# Patient Record
Sex: Male | Born: 1975 | Race: White | Hispanic: No | Marital: Married | Smoking: Never smoker
Health system: Southern US, Community
[De-identification: ages and names within clinical notes are randomized; demographics above are authoritative.]

## PROBLEM LIST (undated history)

## (undated) DIAGNOSIS — M5430 Sciatica, unspecified side: Secondary | ICD-10-CM

## (undated) HISTORY — PX: TOOTH EXTRACTION: SUR596

---

## 2011-06-22 ENCOUNTER — Encounter (HOSPITAL_COMMUNITY): Payer: Self-pay | Admitting: Emergency Medicine

## 2011-06-22 ENCOUNTER — Emergency Department (INDEPENDENT_AMBULATORY_CARE_PROVIDER_SITE_OTHER)
Admission: EM | Admit: 2011-06-22 | Discharge: 2011-06-22 | Disposition: A | Discharge status: Home / Self Care | Source: Home / Self Care | Attending: Emergency Medicine | Admitting: Emergency Medicine

## 2011-06-22 DIAGNOSIS — M5430 Sciatica, unspecified side: Secondary | ICD-10-CM

## 2011-06-22 DIAGNOSIS — IMO0002 Reserved for concepts with insufficient information to code with codable children: Secondary | ICD-10-CM

## 2011-06-22 MED ORDER — HYDROCODONE-IBUPROFEN 7.5-200 MG PO TABS: 1.0000 | ORAL_TABLET | Freq: Three times a day (TID) | ORAL | Status: AC | PRN

## 2011-06-22 MED ORDER — CYCLOBENZAPRINE HCL 10 MG PO TABS: 10.0000 mg | ORAL_TABLET | Freq: Three times a day (TID) | ORAL | Status: AC | PRN

## 2011-06-22 NOTE — Discharge Instructions (Signed)
As discussed take both prescribed medicines consistently for the next 3-5 days. Has discomfort improves try illustrated exercises. Should experience improvement within the next 3-5 days if any worsening or further concerns return for recheck. If pain persists beyond 7-10 days followup with orthopedic provider as discussed    Back Exercises Back exercises help treat and prevent back injuries. The goal of back exercises is to increase the strength of your abdominal and back muscles and the flexibility of your back. These exercises should be started when you no longer have back pain. Back exercises include:  Pelvic Tilt. Lie on your back with your knees bent. Tilt your pelvis until the lower part of your back is against the floor. Hold this position 5 to 10 sec and repeat 5 to 10 times.   Knee to Chest. Pull first 1 knee up against your chest and hold for 20 to 30 seconds, repeat this with the other knee, and then both knees. This may be done with the other leg straight or bent, whichever feels better.   Sit-Ups or Curl-Ups. Bend your knees 90 degrees. Start with tilting your pelvis, and do a partial, slow sit-up, lifting your trunk only 30 to 45 degrees off the floor. Take at least 2 to 3 seconds for each sit-up. Do not do sit-ups with your knees out straight. If partial sit-ups are difficult, simply do the above but with only tightening your abdominal muscles and holding it as directed.   Hip-Lift. Lie on your back with your knees flexed 90 degrees. Push down with your feet and shoulders as you raise your hips a couple inches off the floor; hold for 10 seconds, repeat 5 to 10 times.   Back arches. Lie on your stomach, propping yourself up on bent elbows. Slowly press on your hands, causing an arch in your low back. Repeat 3 to 5 times. Any initial stiffness and discomfort should lessen with repetition over time.   Shoulder-Lifts. Lie face down with arms beside your body. Keep hips and torso pressed  to floor as you slowly lift your head and shoulders off the floor.  Do not overdo your exercises, especially in the beginning. Exercises may cause you some mild back discomfort which lasts for a few minutes; however, if the pain is more severe, or lasts for more than 15 minutes, do not continue exercises until you see your caregiver. Improvement with exercise therapy for back problems is slow.  See your caregivers for assistance with developing a proper back exercise program. Document Released: 03/31/2004 Document Revised: 02/10/2011 Document Reviewed: 02/21/2005 [facility] Patient Information 2012 [place], [place].

## 2011-06-22 NOTE — ED Notes (Signed)
PT HERE WITH C/O LOWER RIGHT BACK PAIN RADIATING TO HIP AND R KNEE THAT STARTED Monday AFTER INSTALLING KITCHEN CABINETS.PT STATES HE TRIED BACK EXERCISES AND IBUPROFEN BUT PAIN WORSENED YESTERDAY WITH TWISTING.DENIES NUMB/TINGLING.

## 2011-06-22 NOTE — ED Provider Notes (Signed)
History     CSN: [id number]  Arrival date & time 06/22/11  7846   First MD Initiated Contact with Patient 06/22/11 [phone]      Chief Complaint  Patient presents with  . Back Pain    (Consider location/radiation/quality/duration/timing/severity/associated sxs/prior treatment) HPI Comments: Patient spent part of this week and is telling some kitchen cabinets and exerting some significant physical efforts into this instillation process. Started experiencing mainly lower right back pain which radiates towards his hip and the right side of his thigh up to his right knee has been increasing since Monday. Some degree of stiffness and can identify certain movements that make pain exacerbates. Patient denies any falls injuries or any skin changes. Denies any numbness or tingling sensations. Patient also denies any fevers or muscular weakness. No urinary symptoms  Patient is a 36 y.o. male presenting with back pain. The history is provided by the patient.  Back Pain  This is a new problem. The current episode started more than 2 days ago. The problem occurs constantly. The problem has been gradually worsening. The pain is associated with lifting heavy objects and twisting. The pain is moderate. The symptoms are aggravated by twisting and bending. Stiffness is present all day. Pertinent negatives include no fever, no numbness, no abdominal pain, no bladder incontinence, no dysuria, no pelvic pain, no paresthesias, no paresis and no tingling. He has tried NSAIDs for the symptoms. The treatment provided mild relief.    History reviewed. No pertinent past medical history.  Past Surgical History  Procedure Date  . Tooth extraction     No family history on file.  History  Substance Use Topics  . Smoking status: Never Smoker   . Smokeless tobacco: Not on file  . Alcohol Use: Yes      Review of Systems  Constitutional: Negative for fever.  HENT: Negative for congestion and neck pain.     Cardiovascular: Negative for palpitations.  Gastrointestinal: Negative for abdominal pain.  Genitourinary: Negative for bladder incontinence, dysuria, flank pain, discharge and pelvic pain.  Musculoskeletal: Positive for back pain. Negative for myalgias, joint swelling and gait problem.  Neurological: Negative for tingling, numbness and paresthesias.    Allergies  Lidocaine and Marcaine  Home Medications   Current Outpatient Rx  Name Route Sig Dispense Refill  . CYCLOBENZAPRINE HCL 10 MG PO TABS Oral Take 1 tablet (10 mg total) by mouth 3 (three) times daily as needed for muscle spasms. 20 tablet 0  . HYDROCODONE-IBUPROFEN 7.5-200 MG PO TABS Oral Take 1 tablet by mouth every 8 (eight) hours as needed for pain. 30 tablet 0    BP 130/75  Pulse 58  Temp(Src) 97.8 F (36.6 C) (Oral)  Resp 14  SpO2 100%  Physical Exam  Nursing note and vitals reviewed. Constitutional: He appears well-developed and well-nourished. No distress.  Abdominal: Soft.  Musculoskeletal: He exhibits tenderness.       Lumbar back: He exhibits decreased range of motion, tenderness and pain. He exhibits no swelling, no edema, no deformity, no laceration and normal pulse.       Back:  Skin: No rash noted.    ED Course  Procedures (including critical care time)  Labs Reviewed - No data to display No results found.   1. Lumbar sprain and strain   2. Sciatic nerve pain       MDM  Paravertebral lumbar back sprain with sciatica distribution of pain pulse physical work during the weekend. Have recommended symptom management for  the next 72 hours with and states and muscle relaxers followup as necessary with [place] or orthopedic provider. Patient agree with treatment plan and followup care as necessary.        Jimmie Molly, MD 06/22/11 1351

## 2011-08-25 ENCOUNTER — Emergency Department (HOSPITAL_COMMUNITY)
Admission: EM | Admit: 2011-08-25 | Discharge: 2011-08-25 | Disposition: A | Discharge status: Home / Self Care | Attending: Emergency Medicine | Admitting: Emergency Medicine

## 2011-08-25 ENCOUNTER — Encounter (HOSPITAL_COMMUNITY): Payer: Self-pay | Admitting: *Deleted

## 2011-08-25 DIAGNOSIS — M542 Cervicalgia: Secondary | ICD-10-CM | POA: Insufficient documentation

## 2011-08-25 DIAGNOSIS — R51 Headache: Secondary | ICD-10-CM | POA: Insufficient documentation

## 2011-08-25 DIAGNOSIS — Z884 Allergy status to anesthetic agent status: Secondary | ICD-10-CM | POA: Insufficient documentation

## 2011-08-25 HISTORY — DX: Sciatica, unspecified side: M54.30

## 2011-08-25 MED ORDER — IBUPROFEN 800 MG PO TABS
800.0000 mg | ORAL_TABLET | Freq: Once | ORAL | Status: AC
  Administered 2011-08-25: 800 mg via ORAL
  Filled 2011-08-25: qty 1

## 2011-08-25 MED ORDER — CYCLOBENZAPRINE HCL 10 MG PO TABS: 10.0000 mg | ORAL_TABLET | Freq: Two times a day (BID) | ORAL | Status: AC | PRN

## 2011-08-25 MED ORDER — IBUPROFEN 800 MG PO TABS: 800.0000 mg | ORAL_TABLET | Freq: Three times a day (TID) | ORAL | Status: AC
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
## 2011-08-29 NOTE — ED Provider Notes (Signed)
Medical screening examination/treatment/procedure(s) were performed by non-physician practitioner and as supervising physician I was immediately available for consultation/collaboration.   Carleene Cooper III, MD 08/29/11 2105

## 2011-09-01 ENCOUNTER — Other Ambulatory Visit: Payer: Self-pay | Admitting: Occupational Medicine

## 2011-09-01 ENCOUNTER — Ambulatory Visit: Payer: Self-pay

## 2011-09-01 DIAGNOSIS — M25559 Pain in unspecified hip: Secondary | ICD-10-CM

## 2011-12-05 ENCOUNTER — Other Ambulatory Visit: Payer: Self-pay | Admitting: Occupational Medicine

## 2011-12-05 ENCOUNTER — Ambulatory Visit: Payer: Self-pay

## 2011-12-05 DIAGNOSIS — R52 Pain, unspecified: Secondary | ICD-10-CM

## 2013-04-11 ENCOUNTER — Emergency Department (HOSPITAL_COMMUNITY)
Admission: EM | Admit: 2013-04-11 | Discharge: 2013-04-11 | Disposition: A | Discharge status: Home / Self Care | Attending: Emergency Medicine | Admitting: Emergency Medicine

## 2013-04-11 ENCOUNTER — Encounter (HOSPITAL_COMMUNITY): Payer: Self-pay | Admitting: Emergency Medicine

## 2013-04-11 DIAGNOSIS — Z8739 Personal history of other diseases of the musculoskeletal system and connective tissue: Secondary | ICD-10-CM | POA: Insufficient documentation

## 2013-04-11 DIAGNOSIS — N200 Calculus of kidney: Secondary | ICD-10-CM | POA: Insufficient documentation

## 2013-04-11 LAB — POCT I-STAT, CHEM 8
BUN: 15 mg/dL (ref 6–23)
CALCIUM ION: 1.22 mmol/L (ref 1.12–1.23)
CHLORIDE: 101 meq/L (ref 96–112)
Creatinine, Ser: 1.3 mg/dL (ref 0.50–1.35)
GLUCOSE: 92 mg/dL (ref 70–99)
HCT: 46 % (ref 39.0–52.0)
Hemoglobin: 15.6 g/dL (ref 13.0–17.0)
POTASSIUM: 4.1 meq/L (ref 3.7–5.3)
Sodium: 140 mEq/L (ref 137–147)
TCO2: 27 mmol/L (ref 0–100)

## 2013-04-11 LAB — URINALYSIS, ROUTINE W REFLEX MICROSCOPIC
BILIRUBIN URINE: NEGATIVE
GLUCOSE, UA: NEGATIVE mg/dL
HGB URINE DIPSTICK: NEGATIVE
KETONES UR: NEGATIVE mg/dL
Leukocytes, UA: NEGATIVE
Nitrite: NEGATIVE
PH: 7 (ref 5.0–8.0)
Protein, ur: NEGATIVE mg/dL
SPECIFIC GRAVITY, URINE: 1.004 — AB (ref 1.005–1.030)
Urobilinogen, UA: 0.2 mg/dL (ref 0.0–1.0)

## 2013-04-11 MED ORDER — HYDROCODONE-ACETAMINOPHEN 5-325 MG PO TABS: 1.0000 | ORAL_TABLET | Freq: Four times a day (QID) | ORAL | Status: AC | PRN

## 2013-04-11 NOTE — ED Notes (Signed)
Pt states he is having right flank pain that started this morning around 0430  Pt states his back was hurting a little yesterday in the same area but is worse this morning

## 2013-04-11 NOTE — Discharge Instructions (Signed)
Kidney Stones  Kidney stones (urolithiasis) are deposits that form inside your kidneys. The intense pain is caused by the stone moving through the urinary tract. When the stone moves, the ureter goes into spasm around the stone. The stone is usually passed in the urine.   CAUSES   · A disorder that makes certain neck glands produce too much parathyroid hormone (primary hyperparathyroidism).  · A buildup of uric acid crystals, similar to gout in your joints.  · Narrowing (stricture) of the ureter.  · A kidney obstruction present at birth (congenital obstruction).  · Previous surgery on the kidney or ureters.  · Numerous kidney infections.  SYMPTOMS   · Feeling sick to your stomach (nauseous).  · Throwing up (vomiting).  · Blood in the urine (hematuria).  · Pain that usually spreads (radiates) to the groin.  · Frequency or urgency of urination.  DIAGNOSIS   · Taking a history and physical exam.  · Blood or urine tests.  · CT scan.  · Occasionally, an examination of the inside of the urinary bladder (cystoscopy) is performed.  TREATMENT   · Observation.  · Increasing your fluid intake.  · Extracorporeal shock wave lithotripsy This is a noninvasive procedure that uses shock waves to break up kidney stones.  · Surgery may be needed if you have severe pain or persistent obstruction. There are various surgical procedures. Most of the procedures are performed with the use of small instruments. Only small incisions are needed to accommodate these instruments, so recovery time is minimized.  The size, location, and chemical composition are all important variables that will determine the proper choice of action for you. Talk to your health care provider to better understand your situation so that you will minimize the risk of injury to yourself and your kidney.   HOME CARE INSTRUCTIONS   · Drink enough water and fluids to keep your urine clear or pale yellow. This will help you to pass the stone or stone fragments.  · Strain  all urine through the provided strainer. Keep all particulate matter and stones for your health care provider to see. The stone causing the pain may be as small as a grain of salt. It is very important to use the strainer each and every time you pass your urine. The collection of your stone will allow your health care provider to analyze it and verify that a stone has actually passed. The stone analysis will often identify what you can do to reduce the incidence of recurrences.  · Only take over-the-counter or prescription medicines for pain, discomfort, or fever as directed by your health care provider.  · Make a follow-up appointment with your health care provider as directed.  · Get follow-up X-rays if required. The absence of pain does not always mean that the stone has passed. It may have only stopped moving. If the urine remains completely obstructed, it can cause loss of kidney function or even complete destruction of the kidney. It is your responsibility to make sure X-rays and follow-ups are completed. Ultrasounds of the kidney can show blockages and the status of the kidney. Ultrasounds are not associated with any radiation and can be performed easily in a matter of minutes.  SEEK MEDICAL CARE IF:  · You experience pain that is progressive and unresponsive to any pain medicine you have been prescribed.  SEEK IMMEDIATE MEDICAL CARE IF:   · Pain cannot be controlled with the prescribed medicine.  · You have a fever   or shaking chills.  · The severity or intensity of pain increases over 18 hours and is not relieved by pain medicine.  · You develop a new onset of abdominal pain.  · You feel faint or pass out.  · You are unable to urinate.  MAKE SURE YOU:   · Understand these instructions.  · Will watch your condition.  · Will get help right away if you are not doing well or get worse.  Document Released: 02/21/2005 Document Revised: 10/24/2012 Document Reviewed: 07/25/2012  [employer]® Patient Information ©2014  [employer].

## 2013-04-11 NOTE — ED Provider Notes (Addendum)
CSN: [id number]     Arrival date & time 04/11/13  4098 History   First MD Initiated Contact with Patient 04/11/13 [phone]     Chief Complaint  Patient presents with  . Flank Pain   (Consider location/radiation/quality/duration/timing/severity/associated sxs/prior Treatment) Patient is a 38 y.o. male presenting with flank pain. The history is provided by the patient.  Flank Pain This is a new problem. The current episode started 3 to 5 hours ago. The problem occurs constantly. The problem has been rapidly improving. Pertinent negatives include no abdominal pain and no shortness of breath. Associated symptoms comments: No fever, n/v or change in urine.  Denies dysuria.  About 2 weeks ago had a similar pain which was less severe that resolved.. Nothing (he can't do anything to worsen the pain.  Bending/twisting/lifting makes no difference) aggravates the symptoms. Nothing relieves the symptoms. He has tried nothing for the symptoms. The treatment provided significant relief.    Past Medical History  Diagnosis Date  . Sciatica    Past Surgical History  Procedure Laterality Date  . Tooth extraction     Family History  Problem Relation Age of Onset  . Hypertension Mother   . Hypertension Father   . Diabetes Father   . COPD Father    History  Substance Use Topics  . Smoking status: Never Smoker   . Smokeless tobacco: Not on file  . Alcohol Use: Yes     Comment: occ    Review of Systems  Constitutional: Negative for fever.  Respiratory: Negative for shortness of breath.   Gastrointestinal: Negative for nausea, vomiting and abdominal pain.  Genitourinary: Positive for flank pain. Negative for dysuria, hematuria and testicular pain.  All other systems reviewed and are negative.    Allergies  Bupivacaine hcl and Lidocaine  Home Medications  No current outpatient prescriptions on file. BP 122/62  Pulse 50  Temp(Src) 98.7 F (37.1 C) (Oral)  Resp 14  SpO2 99% Physical Exam   Nursing note and vitals reviewed. Constitutional: He is oriented to person, place, and time. He appears well-developed and well-nourished. No distress.  HENT:  Head: Normocephalic and atraumatic.  Mouth/Throat: Oropharynx is clear and moist.  Eyes: Conjunctivae and EOM are normal. Pupils are equal, round, and reactive to light.  Neck: Normal range of motion. Neck supple.  Cardiovascular: Normal rate.   Pulmonary/Chest: Effort normal.  Abdominal: Soft. He exhibits no distension. There is no tenderness. There is no rebound, no guarding and no CVA tenderness.  Musculoskeletal: Normal range of motion. He exhibits no edema and no tenderness.  Neurological: He is alert and oriented to person, place, and time.  Skin: Skin is warm and dry. No rash noted. No erythema.  Psychiatric: He has a normal mood and affect. His behavior is normal.    ED Course  Procedures (including critical care time) Labs Review Labs Reviewed  URINALYSIS, ROUTINE W REFLEX MICROSCOPIC - Abnormal; Notable for the following:    Specific Gravity, Urine 1.004 (*)    All other components within normal limits  POCT I-STAT, CHEM 8   Imaging Review No results found.  EKG Interpretation   None       MDM   1. Kidney stone on right side     Pt with sx concerning for KS.  Right flank pain without radiation that recurred at 4:30 am where wife states he was diaphoretic and extremely uncomfortable.  States about 2 weeks ago had a similar pain in the right flank that  resolved and was not as severe.  Pt currently has only a dull ache.  Denies N/V/D or fever.  No prior hx of KS.  Will get UA and i-stat to evaluate.  7:59 AM uA and i-stat wnl.  Will have pt use NSAIDs prn for pain and give ppx if pain worsens.  Also given f/u with urology.  Gwyneth SproutWhitney Diannie Willner, MD 04/11/13 0800  Gwyneth SproutWhitney Katriel Cutsforth, MD 04/11/13 [phone]

## 2017-07-11 ENCOUNTER — Other Ambulatory Visit: Payer: Self-pay | Admitting: Family Medicine

## 2017-07-11 ENCOUNTER — Ambulatory Visit
Admission: RE | Admit: 2017-07-11 | Discharge: 2017-07-11 | Disposition: A | Discharge status: Home / Self Care | Payer: 59 | Source: Ambulatory Visit | Attending: Family Medicine | Admitting: Family Medicine

## 2017-07-11 DIAGNOSIS — R102 Pelvic and perineal pain: Secondary | ICD-10-CM

## 2017-07-26 ENCOUNTER — Other Ambulatory Visit: Payer: Self-pay | Admitting: Family Medicine

## 2017-07-26 DIAGNOSIS — R102 Pelvic and perineal pain: Secondary | ICD-10-CM

## 2017-07-26 DIAGNOSIS — R1033 Periumbilical pain: Secondary | ICD-10-CM

## 2017-07-28 ENCOUNTER — Ambulatory Visit
Admission: RE | Admit: 2017-07-28 | Discharge: 2017-07-28 | Disposition: A | Discharge status: Home / Self Care | Payer: 59 | Source: Ambulatory Visit | Attending: Family Medicine | Admitting: Family Medicine

## 2017-07-28 DIAGNOSIS — R102 Pelvic and perineal pain: Secondary | ICD-10-CM

## 2017-07-28 DIAGNOSIS — R1033 Periumbilical pain: Secondary | ICD-10-CM

## 2017-07-28 MED ORDER — IOPAMIDOL (ISOVUE-300) INJECTION 61%
100.0000 mL | Freq: Once | INTRAVENOUS | Status: AC | PRN
  Administered 2017-07-28: 100 mL via INTRAVENOUS

## 2019-08-29 IMAGING — DX DG ABDOMEN 2V
2 series · 2 of 2 positions shown · non-contrast
Comparison: Abdominal and pelvic CT scan April 12, 2013

CLINICAL DATA: Periumbilical abdominal pain for the past 1-2 weeks.
The pain comes after strenuous activity or heavy lifting, running,
or squatting. No history of injury.

EXAM:
ABDOMEN - 2 VIEW

[dg abd 2 views (1 of 2)]
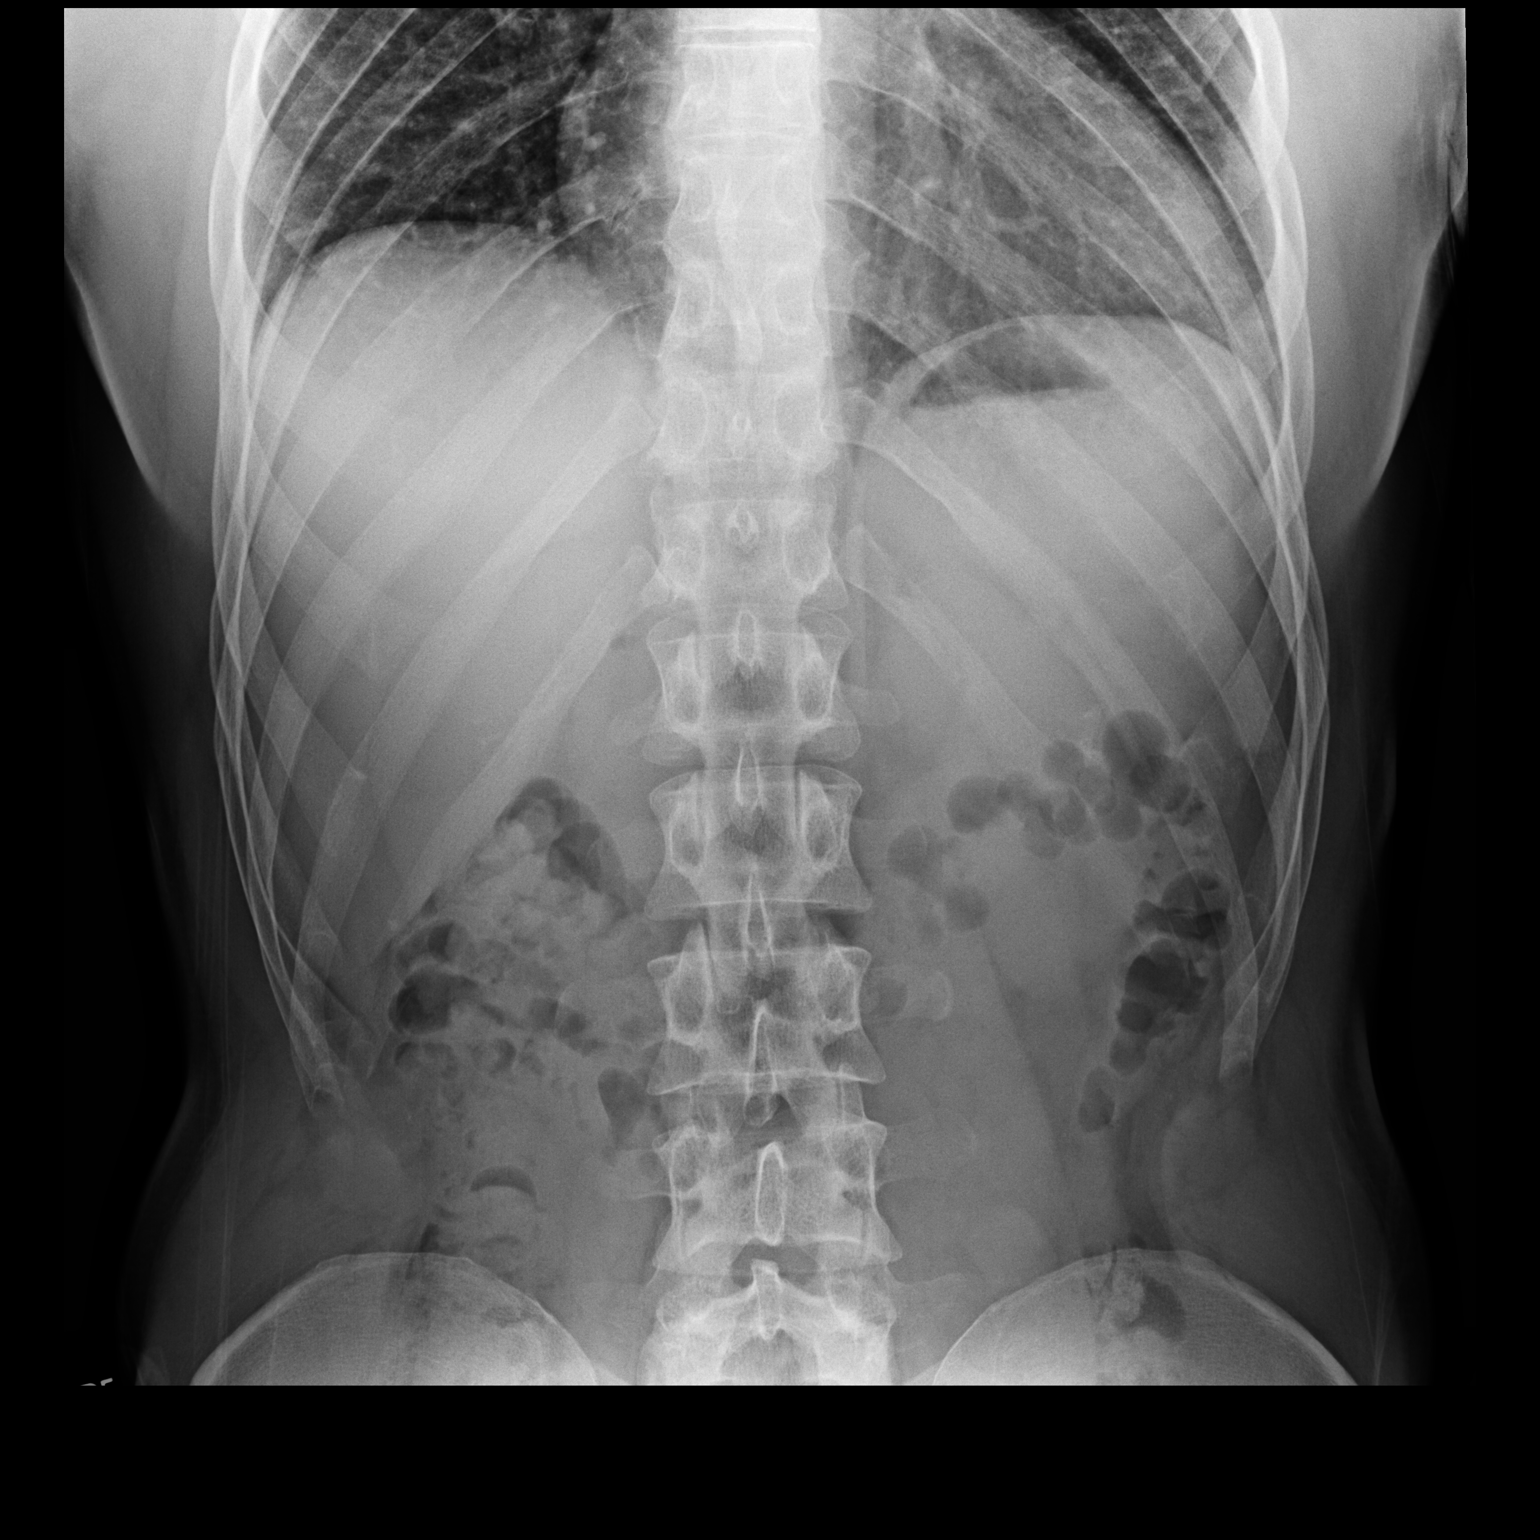

[dg abd 2 views (2 of 2)]
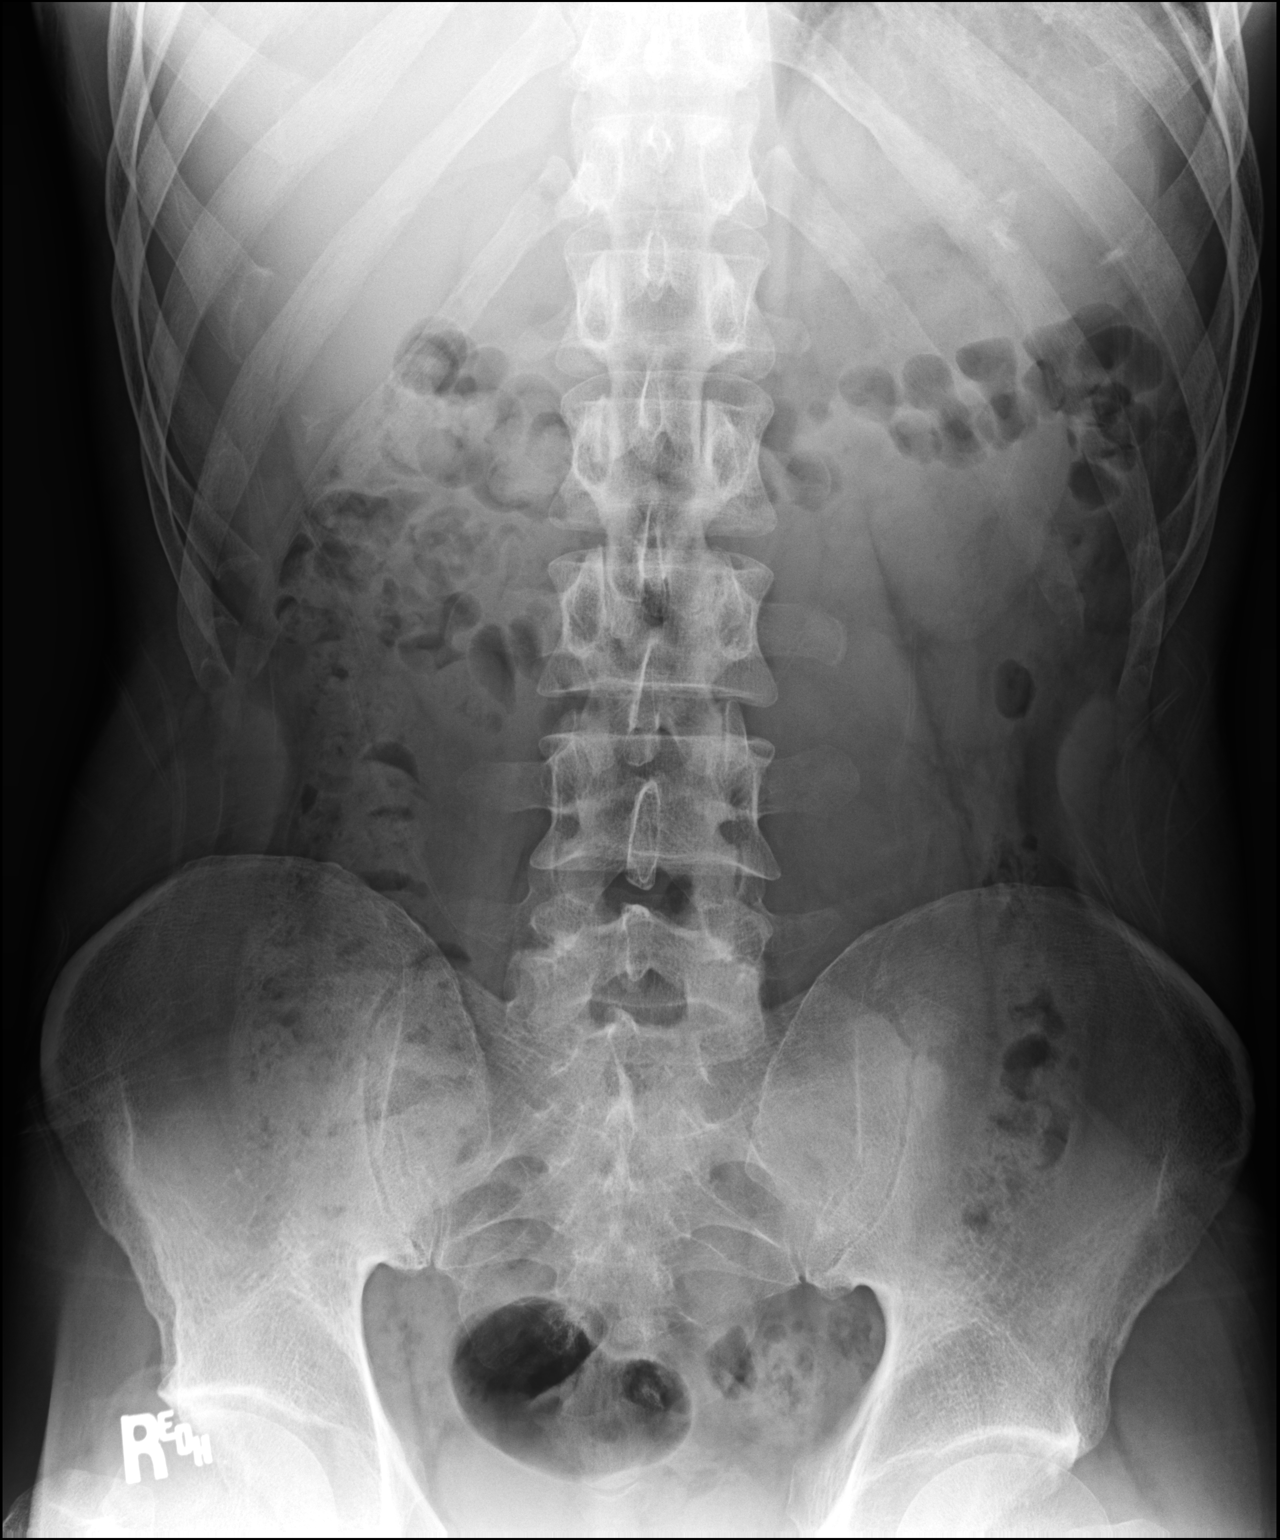

[2 of 2 positions shown; findings below may reference images not displayed]

FINDINGS: The bowel gas pattern is normal. The soft tissues exhibit
calcifications likely lying in the lower costal cartilages. No
kidney stones are observed. The lumbar spine is unremarkable.
IMPRESSION: No acute intra-abdominal abnormality is observed.
